# Patient Record
Sex: Female | Born: 1970 | Race: Black or African American | Hispanic: No | Marital: Married | Smoking: Never smoker
Health system: Southern US, Community
[De-identification: ages and names within clinical notes are randomized; demographics above are authoritative.]

## PROBLEM LIST (undated history)

## (undated) DIAGNOSIS — M1990 Unspecified osteoarthritis, unspecified site: Secondary | ICD-10-CM

## (undated) HISTORY — PX: BACK SURGERY: SHX140

---

## 2000-04-18 ENCOUNTER — Inpatient Hospital Stay (HOSPITAL_COMMUNITY): Admission: AD | Admit: 2000-04-18 | Discharge: 2000-04-18 | Payer: Self-pay | Admitting: Obstetrics

## 2000-05-05 ENCOUNTER — Ambulatory Visit (HOSPITAL_COMMUNITY): Admission: RE | Admit: 2000-05-05 | Discharge: 2000-05-05 | Payer: Self-pay | Admitting: *Deleted

## 2000-09-14 ENCOUNTER — Inpatient Hospital Stay (HOSPITAL_COMMUNITY): Admission: AD | Admit: 2000-09-14 | Discharge: 2000-09-18 | Payer: Self-pay | Admitting: *Deleted

## 2002-04-18 ENCOUNTER — Emergency Department (HOSPITAL_COMMUNITY): Admission: EM | Admit: 2002-04-18 | Discharge: 2002-04-18 | Payer: Self-pay | Admitting: Emergency Medicine

## 2002-06-06 ENCOUNTER — Emergency Department (HOSPITAL_COMMUNITY): Admission: EM | Admit: 2002-06-06 | Discharge: 2002-06-06 | Payer: Self-pay | Admitting: Emergency Medicine

## 2002-06-06 ENCOUNTER — Encounter: Payer: Self-pay | Admitting: Emergency Medicine

## 2002-07-10 ENCOUNTER — Encounter: Payer: Self-pay | Admitting: Internal Medicine

## 2002-07-10 ENCOUNTER — Ambulatory Visit (HOSPITAL_COMMUNITY): Admission: RE | Admit: 2002-07-10 | Discharge: 2002-07-10 | Payer: Self-pay | Admitting: Internal Medicine

## 2006-01-11 ENCOUNTER — Ambulatory Visit (HOSPITAL_COMMUNITY): Admission: RE | Admit: 2006-01-11 | Discharge: 2006-01-11 | Payer: Self-pay | Admitting: *Deleted

## 2006-02-10 ENCOUNTER — Inpatient Hospital Stay (HOSPITAL_COMMUNITY): Admission: AD | Admit: 2006-02-10 | Discharge: 2006-02-10 | Payer: Self-pay | Admitting: Obstetrics & Gynecology

## 2006-06-16 ENCOUNTER — Inpatient Hospital Stay (HOSPITAL_COMMUNITY): Admission: AD | Admit: 2006-06-16 | Discharge: 2006-06-19 | Payer: Self-pay | Admitting: Obstetrics & Gynecology

## 2007-01-18 ENCOUNTER — Ambulatory Visit (HOSPITAL_COMMUNITY): Admission: RE | Admit: 2007-01-18 | Discharge: 2007-01-19 | Payer: Self-pay | Admitting: Specialist

## 2008-06-24 ENCOUNTER — Ambulatory Visit (HOSPITAL_COMMUNITY): Admission: RE | Admit: 2008-06-24 | Discharge: 2008-06-24 | Payer: Self-pay | Admitting: Family Medicine

## 2008-07-14 ENCOUNTER — Ambulatory Visit (HOSPITAL_COMMUNITY): Admission: RE | Admit: 2008-07-14 | Discharge: 2008-07-14 | Payer: Self-pay | Admitting: Family Medicine

## 2008-08-11 ENCOUNTER — Ambulatory Visit (HOSPITAL_COMMUNITY): Admission: RE | Admit: 2008-08-11 | Discharge: 2008-08-11 | Payer: Self-pay | Admitting: Family Medicine

## 2008-10-09 ENCOUNTER — Ambulatory Visit (HOSPITAL_COMMUNITY): Admission: RE | Admit: 2008-10-09 | Discharge: 2008-10-09 | Payer: Self-pay | Admitting: Family Medicine

## 2008-11-12 ENCOUNTER — Ambulatory Visit: Payer: Self-pay | Admitting: Obstetrics and Gynecology

## 2008-11-12 ENCOUNTER — Inpatient Hospital Stay (HOSPITAL_COMMUNITY): Admission: AD | Admit: 2008-11-12 | Discharge: 2008-11-12 | Payer: Self-pay | Admitting: Gynecology

## 2008-11-13 ENCOUNTER — Inpatient Hospital Stay (HOSPITAL_COMMUNITY): Admission: RE | Admit: 2008-11-13 | Discharge: 2008-11-16 | Payer: Self-pay | Admitting: Obstetrics & Gynecology

## 2008-11-13 ENCOUNTER — Ambulatory Visit: Payer: Self-pay | Admitting: Obstetrics & Gynecology

## 2011-01-18 LAB — CROSSMATCH: ABO/RH(D): O POS

## 2011-01-18 LAB — CBC
HCT: 27.7 % — ABNORMAL LOW (ref 36.0–46.0)
HCT: 34.1 % — ABNORMAL LOW (ref 36.0–46.0)
Hemoglobin: 11.2 g/dL — ABNORMAL LOW (ref 12.0–15.0)
Hemoglobin: 9.3 g/dL — ABNORMAL LOW (ref 12.0–15.0)
MCHC: 32.8 g/dL (ref 30.0–36.0)
MCHC: 33.7 g/dL (ref 30.0–36.0)
MCV: 81.2 fL (ref 78.0–100.0)
MCV: 81.5 fL (ref 78.0–100.0)
Platelets: 146 10*3/uL — ABNORMAL LOW (ref 150–400)
Platelets: 175 10*3/uL (ref 150–400)
RBC: 3.4 MIL/uL — ABNORMAL LOW (ref 3.87–5.11)
RBC: 4.2 MIL/uL (ref 3.87–5.11)
RDW: 14.3 % (ref 11.5–15.5)
RDW: 14.4 % (ref 11.5–15.5)
WBC: 6.1 10*3/uL (ref 4.0–10.5)
WBC: 7.6 10*3/uL (ref 4.0–10.5)

## 2011-01-18 LAB — ABO/RH: ABO/RH(D): O POS

## 2011-01-18 LAB — RPR: RPR Ser Ql: NONREACTIVE

## 2011-02-15 NOTE — Discharge Summary (Signed)
Anne French, Anne French              ACCOUNT NO.:  [id number]   MEDICAL RECORD NO.:  [id number]          PATIENT TYPE:  INP   LOCATION:  9144                          FACILITY:  [facility]   PHYSICIAN:  Norton Blizzard, MD    DATE OF BIRTH:  14-Dec-1970   DATE OF ADMISSION:  11/13/2008  DATE OF DISCHARGE:  11/16/2008                               DISCHARGE SUMMARY   ADMITTING DIAGNOSIS:  Pregnancy at term for repeat cesarean section.   POSTOPERATIVE DIAGNOSIS:  Repeat low transverse cesarean section with  lysis of adhesions for Dr. Silas Flood and assisted Dr. Noel Gerold.   HOSPITAL COURSE:  Hospital course has been uneventful.  The patient is  ambulating well, taking p.o. fluids and solids well, emptying bladder  without complications, and passing flatus.   PHYSICAL EXAMINATION:  VITAL SIGNS:  Stable.  HEART:  Regular rhythm and rate.  LUNGS:  Clear to auscultation bilaterally.  ABDOMEN:  Soft and nontender.  Bowel sounds are present x4.  Incision is  intact.  No redness, swelling or drainage.  Fundus is firm.  Lochia  small amount.  EXTREMITIES:  There is no edema in the lower extremities.   ASSESSMENT:  Stable postoperative day #3 with anemia and hemoglobin 9.6.   DISCHARGE MEDICATIONS:  1. Chromagen Forte b.i.d.  2. Motrin 600 one p.o. q.6 h. p.r.n. cramping.  3. Lortab 5/325 one p.o. q.4 h. p.r.n. pain.  4. She is to continue her prenatal vitamins.   PLAN:  Discharge her home. The [facility] Nurse is going to come to the  clinic on postop day #5 to remove her staples, and she is to follow up  in 6 weeks to [facility].      Zerita Boers, N.M.      Norton Blizzard, MD  Electronically Signed   DL/MEDQ  D:  04/54/0981  T:  11/16/2008  Job:  [id number]   cc:   Norton Blizzard, MD

## 2011-02-15 NOTE — Op Note (Signed)
Anne French, Anne French              ACCOUNT NO.:  [id number]   MEDICAL RECORD NO.:  [id number]          PATIENT TYPE:  INP   LOCATION:  9144                          [facility]   PHYSICIAN:  Norton Blizzard, MD    DATE OF BIRTH:  1970/12/24   DATE OF PROCEDURE:  11/13/2008  DATE OF DISCHARGE:                               OPERATIVE REPORT   PREOPERATIVE DIAGNOSES:  1. Term intrauterine pregnancy.  2. History of previous cesarean section.  3. Borderline elevated fetoprotein  4. Pelvic adhesive disease.   POSTPROCEDURE DIAGNOSES:  1. Term intrauterine pregnancy.  2. History of previous cesarean section.  3. Borderline elevated fetoprotein.  4. Pelvic adhesive disease.   PROCEDURES:  1. A repeat low-transverse cesarean section.  2. Lysis adhesions.   SURGEON:  Norton Blizzard, MD   ASSISTANT:  Odie Sera, DO   ANESTHESIA:  Spinal and local.   INDICATIONS FOR PROCEDURE:  Anne French is a 40 year old gravida  4 now para 4-0-0-4 who presented at 39-4/7 weeks for an elective repeat  low-transverse cesarean section.  She has a history of 2 previous low-  transverse cesarean sections.  She has an uncomplicated prenatal course  during this pregnancy except for a mildly elevated AFP with normal  ultrasounds.  She has previously been counseled risks and benefits of  repeat low-transverse cesarean section to include, but not limited to  bleeding, infection, damage to internal organs, and a possible need for  further surgeries to include the hysterectomy.  The patient voiced  understanding of these risks and desires to proceed with the procedure.   DESCRIPTION OF PROCEDURE:  The patient was taken to the operating room  where spinal anesthesia was introduced.  She was then prepped and draped  in usual sterile manner and placed in left dorsal supine position.  A  time-out was conducted.  A Pfannenstiel incision was made in the skin  and extended through the subcutaneous  layers down to the fascia.  The  fascia was incised in the midline using Bovie.  The fascial incision was  extended laterally using the Mayo scissors.  The fascia was then  dissected off the underlying rectus muscles with blunt dissection sharp  dissection using the Mayo scissors.  Moderate amount of scar tissue was  noted underlying the fascia.  The rectus muscles were then separated in  the midline and the opening was extended first laterally and then with  blunt dissection with electrocautery.  The rectus muscles were dissected  on the left side approximately one-third of the way across at  approximately the midline position.  The peritoneum was entered bluntly  with a finger.  The peritoneal opening was extended with manual  traction.  Moderate adhesive disease was noted of the peritoneum to the  anterior aspect of the uterus.  The several adhesions were lysed using  electrocautery.  Bladder blade was then placed.  An appropriate opening  to the uterus was obtained.  A transverse incision was made in lower  uterine segment with a scalpel and extended through the myometrial  layers until bulging membranes  were noted.  The uterine incision was  extended with manual traction first and then the right aspect was  extended somewhat further using bandage scissors.  The membranes were  ruptured and clear amniotic fluid was noted.  The fetal head was grasped  and was delivered with the assistance of fundal pressure.  Vacuum was  used after 2 pop offs.  The opening to the uterus was extended laterally  on each side and allowed for adequate delivery of the head.  The head  then delivered.  The mouth and nares were bulb suctioned.  The shoulders  followed by rest of corpus were delivered without difficulty.  The mouth  and nares were bulb suctioned again and spontaneous cry was noted.  Good  tone was noted.  The cord was clamped and cut and the baby was handed to  awaiting NICU staff.  The  placenta then delivered spontaneously, it was  intact and accessory lobe was noted.  The uterus was cleared of clots  and debris using a dry lap sponge.  A Kelly forceps was placed in the  lower aspect of the uterus through the cervix and used to dilate the  cervix slightly.  The uterine incision was then repaired using 0  Monocryl in a running interlocking fashion.  The uterus was closed in a  single layer closure.  Good hemostasis was then noted.  Both fallopian  tubes and ovaries were identified and appeared grossly normal.  The  gutters were cleared of blood and clots.  The fascia was then closed  using 0 PDS suture in a running noninterlocking fashion.  Good  hemostasis was noted.  There were no defects noted in the fascia.  The  skin was then closed with staples in the usual manner.  A pressure  dressing was applied.  Please note before closure of the skin, the  subcutaneous tissues were injected with 10 mL of 0.25% Marcaine without  epinephrine.   FINDINGS:  1. Clear amniotic fluid.  2. Viable female infant  3. Bilateral fallopian tubes grossly normal.   SPECIMENS:  Placenta to labor and delivery.   ESTIMATED BLOOD LOSS:  700 mL.  There no immediate complications.  The  patient was taken to PACU in good condition.      Odie Sera, DO  Electronically Signed     ______________________________  Norton Blizzard, MD    MC/MEDQ  D:  11/13/2008  T:  11/13/2008  Job:  [id number]

## 2011-02-18 NOTE — Op Note (Signed)
NAMEBRITTYN, French              ACCOUNT NO.:  [id number]   MEDICAL RECORD NO.:  [id number]          PATIENT TYPE:  INP   LOCATION:  9144                          [facility]AC[facility]  [facility]   PHYSICIAN:  Roseanna Rainbow, M.D.DATE OF BIRTH:  1971/02/10   DATE OF PROCEDURE:  06/16/2006  DATE OF DISCHARGE:                                 OPERATIVE REPORT   PREOPERATIVE DIAGNOSES:  1. Intrauterine pregnancy at term.  2. History of previous cesarean delivery, declines trial of labor.   POSTOPERATIVE DIAGNOSES:  1. Intrauterine pregnancy at term.  2. History of previous cesarean delivery, declines trial of labor.   OPERATION/PROCEDURE:  Repeat cesarean delivery.   SURGEON:  Roseanna Rainbow, M.D.  Coral Ceo, M.D.   ANESTHESIA:  Spinal.   ESTIMATED BLOOD LOSS:  600 mL.   COMPLICATIONS:  None.   DESCRIPTION OF PROCEDURE:  The patient was taken to the operating room with  an IV running.  A spinal anesthetic was then administered. She was placed in  the dorsal supine position with a leftward tilt and prepped and draped in  the usual sterile fashion.  A Pfannenstiel skin incision was then made with  the scalpel and carried down to the underlying fascia with the Bovie.  The  fascia was then nicked in the midline.  A fascial incision was then extended  bilaterally with curved Mayo scissors.  The superior aspect of the fascial  incision was tented up and the underlying rectus muscle dissected off.  The  inferior aspect of the fascial incision was manipulated in a similar  fashion.  The rectus muscles were separated in the midline.  The parietal  peritoneum was entered.  This incision was then extended superiorly and  inferiorly with good visualization of the bladder.  The bladder blade was  placed. The vesicouterine peritoneum was tented up and entered sharply with  Metzenbaum scissors.  This incision was then extended bilaterally and the  bladder flap created sharply.  The bladder  blade was placed.  The lower  uterine segment was incised in the transverse fashion with the scalpel.  The  uterine incision was then extended bluntly.  The infant's head was delivered  atraumatically.  The oropharynx was suctioned with bulb suction.  The cord  was clamped and cut.  The infant was handed off to the awaiting  neonatologist.  The placenta was then removed.  The intrauterine cavity was  evacuated of any remaining amniotic fluid, clots and debris with a moist  laparotomy sponge.  The uterine incision was then reapproximated in a  running interlocking fashion using 0 Monocryl.  Second imbricating layer of  the same suture was then placed.  Adequate hemostasis was noted.  The  pericolic gutters were then irrigated.  At this point, omental adhesions  involving the parietal peritoneum were then divided with cautery.  The  parietal peritoneum was reapproximated in a running fashion with 2-0 Vicryl.  The fascia was to be approximated in a running fashion using 0 PDS.  The  skin was reapproximated in a subcuticular fashion using 3-0 Monocryl.  Dermabond was then  applied.  One gram of cephazolin had been given at cord clamp.  The patient  was taken to the PACU awake and in stable condition.  Sleep as noted at the  close of the procedure.  The instrument and pack counts were said to be  correct x2.      Roseanna Rainbow, M.D.  Electronically Signed     LAJ/MEDQ  D:  06/16/2006  T:  06/17/2006  Job:  [id number]

## 2011-02-18 NOTE — H&P (Signed)
Anne French, Anne French              ACCOUNT NO.:  [id number]   MEDICAL RECORD NO.:  [id number]          PATIENT TYPE:  INP   LOCATION:  NA                            [facility]AC[facility]  [facility]   PHYSICIAN:  Roseanna Rainbow, M.D.DATE OF BIRTH:  1971-06-09   DATE OF ADMISSION:  06/15/2006  DATE OF DISCHARGE:                                HISTORY & PHYSICAL   CHIEF COMPLAINT:  The patient is a 40 year old, G3, P2 with an estimated  date of confinement of June 21, 2006, with an intrauterine pregnancy at  39+ weeks with a history of a previous cesarean delivery now for an elective  repeat cesarean delivery.   HISTORY OF PRESENT ILLNESS:  Please see the above.   ALLERGIES:  No known drug allergies.   MEDICATIONS:  Percocet, prenatal vitamins.   OB RISK FACTORS:  Previous cesarean delivery.   PRENATAL LABORATORY DATA:  Hemoglobin 11.2, hematocrit 33.3, platelets  204,000.  Chlamydia negative.  Urine culture and sensitivity no growth.  GC  negative.  One-hour GTT 112.  Hepatitis B surface antigen negative.  HIV  negative.  Blood type O positive.  Antibody screen negative.  RPR  nonreactive.  Rubella immune.  Sickle cell negative.   PAST GYNECOLOGICAL HISTORY:  History of female circumcision.  Please see the  above.   PAST MEDICAL HISTORY:  1. GERD.  2. Herniated lumbar disc.   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   SOCIAL HISTORY:  She is a Location manager.  Married living with her spouse.  Does not give any significant history of alcohol use.  She has no  significant smoking history.   FAMILY HISTORY:  No major illnesses known.   PAST OBSTETRICAL HISTORY:  In December 2001, she delivered a live born  female, full-term cesarean delivery.  In March 2000, she delivered a live  born female, 7 pounds 5 ounces, vaginal delivery.   ASSESSMENT:  1. Multiparous with an intrauterine pregnancy at term.  2. History of a previous cesarean delivery.  3.  Declines trial of labor.   PLAN:  Admission with repeat cesarean delivery.      Roseanna Rainbow, M.D.  Electronically Signed     LAJ/MEDQ  D:  06/15/2006  T:  06/15/2006  Job:  [id number]

## 2011-02-18 NOTE — Discharge Summary (Signed)
[facility]  Patient:    Anne French, Anne French                     MRN: [id number] Adm. Date:  [id number] Disc. Date: [id number] Attending:  Antionette Char Dictator:   Ocie Doyne, M.D.                           Discharge Summary  DISCHARGE DIAGNOSIS:          Status post low transverse cesarean section                               with delivery of a single live born.  DISCHARGE MEDICATIONS:        1. Ibuprofen 600 mg p.o. q.6h. p.r.n. pain.                               2. Tylox one p.o. q.6h. p.r.n. pain.                               3. Micronor oral contraceptive pills one p.o. qd                                  to start on September 24, 2000.                               4. Prenatal vitamins one p.o. q.d. x six weeks.  ADMISSION HISTORY AND PHYSICAL:                     This 41 year old G2, P1-0-01, at 40 weeks by 21-week ultrasound, presented to [facility] for a labor check. She stated that she was having contractions, that she felt her membranes had ruptured that morning when she woke up, she was feeling the baby move, and she had some bloody discharge from the vagina.  PAST MEDICAL HISTORY:         Significant for a normal spontaneous vaginal delivery at term in March of 2000 of a 6-pound 6-ounce female infant. 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  MEDICATIONS:                  Prenatal vitamins.  ALLERGIES:                    No known drug allergies.  SOCIAL HISTORY:               Nonsmoker and nondrinker. Denies illicit drug use.  ADMISSION EXAMINATION:  VITAL SIGNS:                  Temperature 98.7, blood pressure 112/67, pulse 67. Fetal heart rate 130-135 with accelerations. No decelerations. Uterine contractions every six to nine minutes.  HEENT:                         Within normal limits.  NECK:  No lymphadenopathy.  CHEST:                        Clear.  CARDIOVASCULAR:               Regular rate and rhythm.  ABDOMEN:                      Soft and gravid.  EXTREMITIES:                  Deep tendon reflexes 1/2. Pulses 2+. Trace edema, no clonus.  CERVIX:                       2 cm, 50%, and -1. She was Nitrazine positive and fern positive.  HOSPITAL COURSE:              The patient was admitted to labor and delivery with routine orders. She was started on some low-dose Pitocin as her cervix was not changing significantly and received an epidural without complications due to increasing discomfort following Pitocin. The following day, in spite of adequate trial of labor, the patient was beginning to fatigue. There was no further descent of the fetus despite expulsive efforts by the mother. A primary low transverse cesarean section was performed secondary to arrest of descent, possibly due to persistent OP position of the fetal head. Estimated blood loss was one liter. There were no complications.  The postpartum course was uncomplicated. The patient was breast-feeding without difficulty at the time of discharge, she stated that she wanted to have an IUD placed at six weeks and would use oral contraceptives until that time. Her pain control was adequate on p.o. medication and she was ambulating well with decreased lochia.  FOLLOWUP:                     Scheduled at [facility] in six weeks following discharge. DD:  10/11/00 TD:  10/11/00 Job: [id number]

## 2011-02-18 NOTE — Op Note (Signed)
[facility]  Patient:    OLA, RAAP                     MRN: [id number] Proc. Date: 09/15/00 Adm. Date:  [id number] Disc. Date: [id number] Attending:  Antionette Char                           Operative Report  PREOPERATIVE DIAGNOSES:       1. Intrauterine pregnancy at term.                               2. Arrest of descent, rule out ____________.  POSTOPERATIVE DIAGNOSES:      1. Intrauterine pregnancy at term.                               2. Arrest of descent, rule out ____________.                               3. Persistent occiput posterior.  OPERATION/PROCEDURE:          Primary low transverse cesarean section via                               Pfannenstiel incision.  SURGEON:                      Roseanna Rainbow, M.D.  ANESTHESIA:                   Epidural anesthesia.  COMPLICATIONS:                None.  ESTIMATED BLOOD LOSS:         One liter.  FLUIDS:                       1500 cc lactated Ringers.  URINE OUTPUT:                 Initially blood tinged prior to surgery with                               clearing at the end of the procedure.                               Approximately 300 cc.  INDICATION FOR PROCEDURE:     The patient is a para 1 at term who presented with spontaneous rupture of membranes.  With Pitocin augmentation, she reached full dilatation with descent to 0 to +1 station.  FINDINGS:                     The infant was in cephalic presentation. Neonatology present at the delivery.  Apgars 9 and 9.  Weight 7 pounds 6 ounces.  Normal uterus, tubes and ovaries.  DESCRIPTION OF PROCEDURE:     This patient was taken to the operating room where epidural anesthetic was found to be adequate.  She was then prepped and draped in the usual sterile fashion in dorsal supine position with a leftward tilt.  The Pfannenstiel  skin incision was then made with the scalpel and carried through to the underlying  layer of fascia.  The fascia was incised in the midline and the incision extended laterally with the Mayo scissors. The superior aspect of the fascial incision was then grasped with Kocher clamps, elevated and underlying rectus muscle dissected off.  Attention was then turned to the inferior aspect of the incision which was manipulated in a similar fashion.  The rectus muscles were separated in the midline and the parietoperitoneum identified, tented up and entered sharply.  The peritoneal incision was then extended superiorly and inferiorly with good visualization of the bladder. The bladder blade was then inserted and the vesicouterine peritoneum identified, grasped with pickups and entered sharply.  This incision was then extended laterally and the bladder flap created digitally. The bladder blade was then reinserted and the lower uterine segment incised in a transverse fashion with the scalpel. The uterine incision was then extended laterally bluntly. The bladder blade was removed and the infants head delivered atraumatically.  The nose and mouth were suctioned with bulb suction and the cord clamped and cut.  The infant was handed off to the awaiting neonatologist.  The placenta was then removed.  The uterus cleared of all clots and debris.  The uterine incision was repaired with 0 Monocryl in running locked fashion.  Individual figure-of-eight sutures of the same were used to obtain excellent hemostasis.  The gutters were cleared of all clots and the fascia was reapproximated with 0 Vicryl in running fashion.  The skin was closed with staples.  The patient tolerated the procedure well.  Sponge, lap and needle counts correct x 2.  There were 2 g of cefazolin given at cord clamp.  The patient was taken to the PACU in stable condition. DD:  09/18/00 TD:  09/19/00 Job: [id number]

## 2011-02-18 NOTE — Op Note (Signed)
NAMEHENRY, Anne              ACCOUNT NO.:  [id number]   MEDICAL RECORD NO.:  [id number]          PATIENT TYPE:  AMB   LOCATION:  DAY                          FACILITY:  [facility]   PHYSICIAN:  Jene Every, M.D.    DATE OF BIRTH:  1970/11/24   DATE OF PROCEDURE:  01/18/2007  DATE OF DISCHARGE:                               OPERATIVE REPORT   PREOPERATIVE DIAGNOSIS:  Spinal stenosis, herniated nucleus pulposus, L5-  S1 left.   POSTOPERATIVE DIAGNOSIS:  Spinal stenosis, herniated nucleus pulposus,  L5-S1 left.   PROCEDURE PERFORMED:  Lateral recess decompression and foraminotomy, S1  left; microdiskectomy, L5-S1 left.   ANESTHESIA:  General.   ASSISTANT:  Roma Schanz, P.A.   BRIEF HISTORY AND INDICATIONS:  A 40 year old with refractory S1  radiculopathy, positive neural tension signs, diminished plantar  flexion, MRI indicating a small disk herniation with lateral recess  stenosis compressing the nerve root.  She was refractory to conservative  treatment, had radicular pain in the outer aspect of the foot, disk  degeneration at L5-S1, had some back pain but mainly buttock and leg  pain.  It felt that decompression of the S1 nerve root would relieve her  symptoms and augment her activities of daily living.  The risks and  benefits were discussed, including bleeding, infection, damage to  vascular structures, CSF leakage, epidural fibrosis, need for fusion in  the future, anesthetic complications etc.   TECHNIQUE:  The patient in supine position.  After the induction of  adequate anesthesia and 2 g Kefzol, she placed prone on the Andrews  frame and all bony prominences well-padded.  The lumbar region was  prepped and draped in the usual sterile fashion.  An 18 gauge-spinal  needle was utilized to localize the L5-S1 space, confirmed with x-ray.  Incision was made at the spinous process of L5 to S1.  The subcutaneous  tissue was dissected, electrocautery utilized to  achieve hemostasis.  The dorsal lumbar fascia was identified and divided in the line of the  skin incision, paraspinous muscle elevated from the lamina of L5 and S1.  A McCullough retractor was placed.  The operating microscope was draped  and brought into the surgical field.  Hemilaminotomy of the caudad edge  of L5 was performed with 2 mm Kerrison, the cephalad edge of S1 with a 2  mm Kerrison.  It was extremely tight into the lateral recess.  After the  foraminotomy was performed, I decompressed the lateral recess to the  medial border of the pedicle.  There was fairly narrow room for the S1  nerve root.  It was found to be erythematous and edematous, signifying  an area of compression.  I detached the ligamentum flavum from the  caudad edge of L5 following the hemilaminotomy and followed the root to  its shoulder.  I then gently mobilized it medially.  A focal HNP was  noted and an annulotomy was performed.  A copious portion of disk  material was removed from the disk with the straight and upbiting  pituitary.  A full diskectomy of herniated material was performed.  I  then checked the foramen of L5, S1 beneath the thecal sac and the  shoulder and the axilla of the root.  There was no evidence of residual  neural compression of the root.  I then copiously irrigated, had no  evidence of CSF leakage or active bleeding.  Bone wax was placed on the  cancellous surfaces.  I removed the McCullough retractor.  Paraspinous  muscles inspected and no evidence of active bleeding.  I irrigated and  the fascia closed with 0 Vicryl simple sutures, subcu with 2-0, skin  with  4-0 subcuticular, wound reinforced with Steri-Strips, sterile dressing  applied, placed supine on her hospital bed, extubated without  difficulty, and transported to the recovery room in satisfactory  condition.   The patient tolerated the procedure well.  There were no complications.      Jene Every, M.D.   Electronically Signed     JB/MEDQ  D:  01/18/2007  T:  01/18/2007  Job:  [id number]

## 2011-02-18 NOTE — Discharge Summary (Signed)
Anne French, Anne French              ACCOUNT NO.:  [id number]   MEDICAL RECORD NO.:  [id number]          PATIENT TYPE:  INP   LOCATION:  9144                          FACILITY:  [facility]   PHYSICIAN:  Roseanna Rainbow, M.D.DATE OF BIRTH:  10-22-1970   DATE OF ADMISSION:  06/16/2006  DATE OF DISCHARGE:  06/19/2006                                 DISCHARGE SUMMARY   CHIEF COMPLAINT:  The patient is a 40 year old gravida 3, para 2 with an  estimated date of confinement of June 21, 2006 with an intrauterine  pregnancy at 39+ weeks with a history of a previous cesarean delivery now  for an elective repeat cesarean delivery.  Please see the dictated history  and physical for further details.   HOSPITAL COURSE:  The patient was admitted and underwent a repeat cesarean  delivery.  Please see the dictated operative summary.  On postoperative day  #1, her hemoglobin was 8.8.  She was hemodynamically stable.  She was  started on ferrous sulfate twice daily.  The remainder of her hospital  course was uneventful.  She was discharged to home on postoperative day #3  with plans for an IUD as a contraceptive method.   DISCHARGE DIAGNOSIS:  Intrauterine pregnancy at term, history of previous  cesarean delivery, declined trial of labor.   PROCEDURE:  Repeat cesarean delivery.   CONDITION:  Good.   DIET:  Regular.   ACTIVITY:  Progressive activity, pelvic rest.   MEDICATIONS:  Percocet, ibuprofen, prenatal vitamins, ferrous sulfate.   DISPOSITION:  The patient was to follow up in the office in 1 to 2 weeks.      Roseanna Rainbow, M.D.  Electronically Signed     LAJ/MEDQ  D:  07/09/2006  T:  07/10/2006  Job:  [id number]

## 2011-12-05 ENCOUNTER — Emergency Department (HOSPITAL_COMMUNITY)
Admission: EM | Admit: 2011-12-05 | Discharge: 2011-12-05 | Disposition: A | Discharge status: Home / Self Care | Payer: Medicaid Other | Attending: Emergency Medicine | Admitting: Emergency Medicine

## 2011-12-05 ENCOUNTER — Encounter (HOSPITAL_COMMUNITY): Payer: Self-pay

## 2011-12-05 DIAGNOSIS — L509 Urticaria, unspecified: Secondary | ICD-10-CM | POA: Insufficient documentation

## 2011-12-05 DIAGNOSIS — L299 Pruritus, unspecified: Secondary | ICD-10-CM | POA: Insufficient documentation

## 2011-12-05 MED ORDER — DIPHENHYDRAMINE HCL 25 MG PO TABS: 25.0000 mg | ORAL_TABLET | Freq: Four times a day (QID) | ORAL | Status: DC

## 2011-12-05 MED ORDER — PREDNISONE 20 MG PO TABS: 60.0000 mg | ORAL_TABLET | Freq: Every day | ORAL | Status: AC

## 2011-12-05 MED ORDER — FLUCONAZOLE 150 MG PO TABS: 300.0000 mg | ORAL_TABLET | Freq: Once | ORAL | Status: AC

## 2011-12-05 MED ORDER — FAMOTIDINE 20 MG PO TABS: 20.0000 mg | ORAL_TABLET | Freq: Two times a day (BID) | ORAL | Status: DC

## 2011-12-05 NOTE — ED Notes (Signed)
Patient presents with rash beginning to face spreading to bilateral hands since this past Friday. Patient reports burning and itching to face and bilateral hands. Small intact bumps are present to bilateral hands and face.

## 2011-12-05 NOTE — ED Notes (Signed)
Pt denies change in soaps, toiletries. Denies travel or new animals in house. Denies change in meds. Has only tried ibuprofen for rash.

## 2011-12-05 NOTE — ED Provider Notes (Signed)
History     CSN: [id number]  Arrival date & time 12/05/11  1301   First MD Initiated Contact with Patient 12/05/11 1412      Chief Complaint  Patient presents with  . Rash    (Consider location/radiation/quality/duration/timing/severity/associated sxs/prior treatment) Patient is a 41 y.o. female presenting with rash. The history is provided by the patient.  Rash  This is a new problem. The current episode started 2 days ago. The problem has not changed since onset.Associated with: She reports cold recently for which she took ibuprofen as she has in the past. No other new medications, soaps, foods. No SOB, swelling or difficulty swallowing. There has been no fever. The rash is present on the face, right hand, torso, neck and left hand. The patient is experiencing no pain. Associated symptoms include itching. She has tried nothing for the symptoms.    History reviewed. No pertinent past medical history.  Past Surgical History  Procedure Date  . Back surgery     History reviewed. No pertinent family history.  History  Substance Use Topics  . Smoking status: Never Smoker   . Smokeless tobacco: Not on file  . Alcohol Use: No    OB History    Grav Para Term Preterm Abortions TAB SAB Ect Mult Living                  Review of Systems  Constitutional: Negative for fever and chills.  HENT: Negative.   Respiratory: Negative.  Negative for shortness of breath.   Cardiovascular: Negative.   Gastrointestinal: Negative.   Musculoskeletal: Negative.   Skin: Positive for itching and rash.  Neurological: Negative.     Allergies  Review of patient's allergies indicates no known allergies.  Home Medications   Current Outpatient Rx  Name Route Sig Dispense Refill  . IBUPROFEN 200 MG PO TABS Oral Take 800 mg by mouth every 6 (six) hours as needed. As needed for pain.      BP 107/77  Pulse 78  Temp 98.9 F (37.2 C)  Resp 18  SpO2 97%  LMP 11/14/2011  Physical Exam    Constitutional: She is oriented to person, place, and time. She appears well-developed and well-nourished.  Neck: Normal range of motion.  Cardiovascular: Normal rate and regular rhythm.   No murmur heard. Pulmonary/Chest: Effort normal and breath sounds normal.  Neurological: She is alert and oriented to person, place, and time.  Skin: Skin is warm and dry.       Raised, urticarial rash to face surrounding eyes,including eye lids. Sclera, conjunctiva normal. Similar rash to hands and neck.     ED Course  Procedures (including critical care time)  Labs Reviewed - No data to display No results found.   No diagnosis found.    MDM          Rodena Medin, PA-C 12/05/11 1523

## 2011-12-05 NOTE — ED Provider Notes (Signed)
Medical screening examination/treatment/procedure(s) were performed by non-physician practitioner and as supervising physician I was immediately available for consultation/collaboration.  Doug Sou, MD 12/05/11 1734

## 2011-12-05 NOTE — Discharge Instructions (Signed)
IF SYMPTOMS PERSIST, FOLLOW UP WITH A DERMATOLOGIST OF YOUR CHOICE. RETURN HERE WITH ANY SWELLING OF TONGUE OR LIPS, DIFFICULTY SWALLOWING OR BREATHING. TAKE MEDICATIONS AS PRESCRIBED.  Allergic Reaction, Mild to Moderate Allergies may happen from anything your body is sensitive to. This may be food, medications, pollens, chemicals, and nearly anything around you in everyday life that produces allergens. An allergen is anything that causes an allergy producing substance. Allergens cause your body to release allergic antibodies. Through a chain of events, they cause a release of histamine into the blood stream. Histamines are meant to protect you, but they also cause your discomfort. This is why antihistamines are often used for allergies. Heredity is often a factor in causing allergic reactions. This means you may have some of the same allergies as your parents. Allergies happen in all age groups. You may have some idea of what caused your reaction. There are many allergens around [place]. It may be difficult to know what caused your reaction. If this is a first time event, it may never happen again. Allergies cannot be cured but can be controlled with medications. SYMPTOMS  You may get some or all of the following problems from allergies.  Swelling and itching in and around the mouth.   Tearing, itchy eyes.   Nasal congestion and runny nose.   Sneezing and coughing.   An itchy red rash or hives.   Vomiting or diarrhea.   Difficulty breathing.  Seasonal allergies occur in all age groups. They are seasonal because they usually occur during the same season every year. They may be a reaction to molds, grass pollens, or tree pollens. Other causes of allergies are house dust mite allergens, pet dander and mold spores. These are just a common few of the thousands of allergens around [place]. All of the symptoms listed above happen when you come in contact with pollens and other allergens. Seasonal allergies are  usually not life threatening. They are generally more of a nuisance that can often be handled using medications. Hay fever is a combination of all or some of the above listed allergy problems. It may often be treated with simple over-the-counter medications such as diphenhydramine. Take medication as directed. Check with your caregiver or package insert for child dosages. TREATMENT AND HOME CARE INSTRUCTIONS If hives or rash are present:  Take medications as directed.   You may use an over-the-counter antihistamine (diphenhydramine) for hives and itching as needed. Do not drive or drink alcohol until medications used to treat the reaction have worn off. Antihistamines tend to make people sleepy.   Apply cold cloths (compresses) to the skin or take baths in cool water. This will help itching. Avoid hot baths or showers. Heat will make a rash and itching worse.   If your allergies persist and become more severe, and over the counter medications are not effective, there are many new medications your caretaker can prescribe. Immunotherapy or desensitizing injections can be used if all else fails. Follow up with your caregiver if problems continue.  SEEK MEDICAL CARE IF:   Your allergies are becoming progressively more troublesome.   You suspect a food allergy. Symptoms generally happen within 30 minutes of eating a food.   Your symptoms have not gone away within 2 days or are getting worse.   You develop new symptoms.   You want to retest yourself or your child with a food or drink you think causes an allergic reaction. Never test yourself or your child of  a suspected allergy without being under the watchful eye of your caregivers. A second exposure to an allergen may be life-threatening.  SEEK IMMEDIATE MEDICAL CARE IF:  You develop difficulty breathing or wheezing, or have a tight feeling in your chest or throat.   You develop a swollen mouth, hives, swelling, or itching all over your body.    A severe reaction with any of the above problems should be considered life-threatening. If you suddenly develop difficulty breathing call for local emergency medical help. THIS IS AN EMERGENCY. MAKE SURE YOU:   Understand these instructions.   Will watch your condition.   Will get help right away if you are not doing well or get worse.  Document Released: 07/17/2007 Document Revised: 09/08/2011 Document Reviewed: 07/17/2007 [facility] Patient Information 2012 [place], [place].

## 2012-05-15 ENCOUNTER — Encounter (HOSPITAL_COMMUNITY): Payer: Self-pay | Admitting: Emergency Medicine

## 2012-05-15 ENCOUNTER — Emergency Department (HOSPITAL_COMMUNITY)
Admission: EM | Admit: 2012-05-15 | Discharge: 2012-05-15 | Disposition: A | Discharge status: Home / Self Care | Payer: Medicaid Other | Attending: Emergency Medicine | Admitting: Emergency Medicine

## 2012-05-15 DIAGNOSIS — J019 Acute sinusitis, unspecified: Secondary | ICD-10-CM | POA: Insufficient documentation

## 2012-05-15 MED ORDER — SALINE NASAL SPRAY 0.65 % NA SOLN: 1.0000 | NASAL | Status: DC | PRN

## 2012-05-15 MED ORDER — AMOXICILLIN-POT CLAVULANATE 875-125 MG PO TABS: 1.0000 | ORAL_TABLET | Freq: Two times a day (BID) | ORAL | Status: AC

## 2012-05-15 NOTE — ED Provider Notes (Signed)
History     CSN: [id number]  Arrival date & time 05/15/12  1256   First MD Initiated Contact with Patient 05/15/12 1426      Chief Complaint  Patient presents with  . Nasal Congestion  . Shortness of Breath    (Consider location/radiation/quality/duration/timing/severity/associated sxs/prior treatment) HPI Comments: Anne French 41 y.o. female   The chief complaint is: Patient presents with:   Nasal Congestion     Patient states that last night she started having myalgias and chills. She awoke in the middle of the night with acute sinus pain, pressure  Nasal congestion and facial swelling. She was very uncomfortable and was unable to sleep.She also had rhinorrhea.  Patient denies SOB, wheezing. Denies sore throat. Patient does have a non productive cough. Denies fevers,nausea, vomiting, diarrhea.     Patient is a 41 y.o. female presenting with shortness of breath. The history is provided by the patient. No language interpreter was used.  Shortness of Breath  Associated symptoms include cough. Pertinent negatives include no chest pain, no fever, no sore throat, no shortness of breath and no wheezing.    History reviewed. No pertinent past medical history.  Past Surgical History  Procedure Date  . Back surgery     L5    History reviewed. No pertinent family history.  History  Substance Use Topics  . Smoking status: Never Smoker   . Smokeless tobacco: Not on file  . Alcohol Use: No    OB History    Grav Para Term Preterm Abortions TAB SAB Ect Mult Living                  Review of Systems  Constitutional: Positive for chills. Negative for fever and diaphoresis.  HENT: Positive for ear pain (ear pressure), facial swelling and sinus pressure. Negative for sore throat, trouble swallowing, neck pain and neck stiffness.   Respiratory: Positive for cough. Negative for chest tightness, shortness of breath and wheezing.   Cardiovascular: Negative for chest pain  and palpitations.  Gastrointestinal: Negative for nausea, vomiting, diarrhea and abdominal distention.  Musculoskeletal: Positive for myalgias.    Allergies  Review of patient's allergies indicates no known allergies.  Home Medications   Current Outpatient Rx  Name Route Sig Dispense Refill  . DIPHENHYDRAMINE HCL 25 MG PO TABS Oral Take 25 mg by mouth every 6 (six) hours as needed. FOR ALLERGIES    . IBUPROFEN 200 MG PO TABS Oral Take 800 mg by mouth every 6 (six) hours as needed. As needed for pain.    Marland Kitchen AMOXICILLIN-POT CLAVULANATE 875-125 MG PO TABS Oral Take 1 tablet by mouth 2 (two) times daily. 14 tablet 0  . DIPHENHYDRAMINE HCL 25 MG PO TABS Oral Take 25 mg by mouth every 6 (six) hours. FOR PAIN    . SALINE NASAL SPRAY 0.65 % NA SOLN Nasal Place 1 spray into the nose as needed for congestion. 30 mL 12    BP 113/74  Pulse 87  Temp 98.8 F (37.1 C) (Oral)  Resp 14  SpO2 99%  Physical Exam  Nursing note and vitals reviewed. Constitutional: She is oriented to person, place, and time. She appears well-developed and well-nourished. No distress.  HENT:  Head: Normocephalic and atraumatic.  Mouth/Throat: Oropharynx is clear and moist. No oropharyngeal exudate.       TMs bulging BL, no signs of AOM. Swelling of the face in the sinus region. Nasal mucosa is swollen and red. No rhinorrhea.  Eyes: Conjunctivae are normal. No scleral icterus.  Neck: Normal range of motion.  Cardiovascular: Normal rate, regular rhythm and normal heart sounds.  Exam reveals no gallop and no friction rub.   No murmur heard. Pulmonary/Chest: Effort normal and breath sounds normal. No respiratory distress. She has no wheezes. She exhibits no tenderness.  Abdominal: Soft. Bowel sounds are normal. She exhibits no distension and no mass. There is no tenderness. There is no guarding.  Lymphadenopathy:    She has cervical adenopathy.  Neurological: She is alert and oriented to person, place, and time.    Skin: Skin is warm and dry. She is not diaphoretic.    ED Course  Procedures (including critical care time)  Labs Reviewed - No data to display No results found.  URI, and sinusitis. Lungs are clear and no cp. Patient looks ill and miserable. Patient was adamant that she have an antibiotic for her sinusitis. I explained that abx are not indicated unless symptoms have not resolved after 10 days.  I gave her an rx for  augmentin with the strict instructions that she should not use the RX unless her symptoms persist after 10 days from onset yesterday. Also that she should return to see [place] if she does have symptoms for 10 days. I also gave her an  Handwritten rx for atrovent nasal spray.  I will d/c with supportive care    1. Acute sinusitis       MDM  DC patient with sxs treatment. Discussed reasons to seek immediate care. Patient expresses understanding and agrees with plan.         Arthor Captain, PA-C 05/15/12 1655

## 2012-05-15 NOTE — ED Notes (Signed)
C/o nasal congestion started last night, unrelieved with Mucinex, and difficulty getting a big breath, no resp distress.

## 2012-05-15 NOTE — ED Provider Notes (Signed)
Medical screening examination/treatment/procedure(s) were performed by non-physician practitioner and as supervising physician I was immediately available for consultation/collaboration.  Doug Sou, MD 05/15/12 [phone]

## 2015-09-02 ENCOUNTER — Emergency Department (HOSPITAL_COMMUNITY)
Admission: EM | Admit: 2015-09-02 | Discharge: 2015-09-02 | Disposition: A | Discharge status: Home / Self Care | Payer: Medicaid Other | Attending: Emergency Medicine | Admitting: Emergency Medicine

## 2015-09-02 ENCOUNTER — Encounter (HOSPITAL_COMMUNITY): Payer: Self-pay | Admitting: Emergency Medicine

## 2015-09-02 ENCOUNTER — Emergency Department (HOSPITAL_COMMUNITY): Payer: Medicaid Other

## 2015-09-02 DIAGNOSIS — R05 Cough: Secondary | ICD-10-CM

## 2015-09-02 DIAGNOSIS — J988 Other specified respiratory disorders: Secondary | ICD-10-CM | POA: Diagnosis not present

## 2015-09-02 DIAGNOSIS — R059 Cough, unspecified: Secondary | ICD-10-CM

## 2015-09-02 DIAGNOSIS — Z79899 Other long term (current) drug therapy: Secondary | ICD-10-CM | POA: Insufficient documentation

## 2015-09-02 MED ORDER — AZITHROMYCIN 250 MG PO TABS: ORAL_TABLET | ORAL | Status: DC

## 2015-09-02 MED ORDER — BENZONATATE 100 MG PO CAPS: 100.0000 mg | ORAL_CAPSULE | Freq: Three times a day (TID) | ORAL | Status: DC

## 2015-09-02 NOTE — ED Provider Notes (Signed)
CSN: [id number]     Arrival date & time 09/02/15  1821 History   First MD Initiated Contact with Patient 09/02/15 1835     Chief Complaint  Patient presents with  . Cough    (Consider location/radiation/quality/duration/timing/severity/associated sxs/prior Treatment) HPI Comments: Patient presents with complaint of cough and fever to 102F at home for the past 2 days. No ear pain, runny nose, sore throat. Treated at home with ibuprofen with some relief. No nausea, vomiting, diarrhea. No chest pain or abdominal pain. Family members are sick at home with similar symptoms. Onset of symptoms acute. Course is constant. Nothing makes symptoms worse.  Patient is a 44 y.o. female presenting with cough. The history is provided by the patient.  Cough Associated symptoms: fever   Associated symptoms: no chest pain, no headaches, no myalgias, no rash, no rhinorrhea, no shortness of breath, no sore throat and no wheezing     History reviewed. No pertinent past medical history. Past Surgical History  Procedure Laterality Date  . Back surgery      L5   History reviewed. No pertinent family history. Social History  Substance Use Topics  . Smoking status: Never Smoker   . Smokeless tobacco: None  . Alcohol Use: No   OB History    No data available     Review of Systems  Constitutional: Positive for fever.  HENT: Negative for rhinorrhea and sore throat.   Eyes: Negative for redness.  Respiratory: Positive for cough. Negative for shortness of breath and wheezing.   Cardiovascular: Negative for chest pain.  Gastrointestinal: Negative for nausea, vomiting, abdominal pain and diarrhea.  Genitourinary: Negative for dysuria.  Musculoskeletal: Negative for myalgias.  Skin: Negative for rash.  Neurological: Negative for headaches.    Allergies  Review of patient's allergies indicates no known allergies.  Home Medications   Prior to Admission medications   Medication Sig Start Date End  Date Taking? Authorizing Provider  diphenhydrAMINE (BENADRYL) 25 MG tablet Take 25 mg by mouth every 6 (six) hours as needed. FOR ALLERGIES    Historical Provider, MD  diphenhydrAMINE (BENADRYL) 25 MG tablet Take 25 mg by mouth every 6 (six) hours. FOR PAIN 12/05/11 01/04/12  Elpidio AnisShari Upstill, PA-C  ibuprofen (ADVIL,MOTRIN) 200 MG tablet Take 800 mg by mouth every 6 (six) hours as needed. As needed for pain.    Historical Provider, MD  sodium chloride (OCEAN NASAL SPRAY) 0.65 % nasal spray Place 1 spray into the nose as needed for congestion. 05/15/12 05/15/13  Arthor CaptainAbigail Harris, PA-C   BP 110/67 mmHg  Pulse 84  Temp(Src) 98.2 F (36.8 C) (Oral)  Resp 18  SpO2 98%   Physical Exam  Constitutional: She appears well-developed and well-nourished.  HENT:  Head: Normocephalic and atraumatic.  Eyes: Conjunctivae are normal. Right eye exhibits no discharge. Left eye exhibits no discharge.  Neck: Normal range of motion. Neck supple.  Cardiovascular: Normal rate, regular rhythm and normal heart sounds.   Pulmonary/Chest: Effort normal and breath sounds normal.  Abdominal: Soft. There is no tenderness.  Neurological: She is alert.  Skin: Skin is warm and dry.  Psychiatric: She has a normal mood and affect.  Nursing note and vitals reviewed.   ED Course  Procedures (including critical care time) Labs Review Labs Reviewed - No data to display  Imaging Review Dg Chest 2 View  09/02/2015  CLINICAL DATA:  Cough, fever, chest pain for 3 days EXAM: CHEST  2 VIEW COMPARISON:  None. FINDINGS: The heart size  and mediastinal contours are within normal limits. Both lungs are clear. The visualized skeletal structures are unremarkable. IMPRESSION: No active cardiopulmonary disease. Electronically Signed   By: Elige Ko   On: 09/02/2015 19:21   I have personally reviewed and evaluated these images and lab results as part of my medical decision-making.   EKG Interpretation None       7:10 PM Patient seen  and examined. Work-up initiated.   Vital signs reviewed and are as follows: BP 110/67 mmHg  Pulse 84  Temp(Src) 98.2 F (36.8 C) (Oral)  Resp 18  SpO2 98%  8:18 PM X-rays negative for pneumonia. Given sick contacts with pneumonia, will give prescription for azithromycin. Patient told that she does not need to fill this immediately. She can continue to monitor symptoms for the next 48 hours. If she develops a high persistent fever or feels worse she should fill antibiotics. If she does not feel better in 48 hours, she should fill antibiotics. Return to emergency department with worsening shortness of breath.    MDM   Final diagnoses:  Cough  Respiratory infection   patient with cough, fever. Chest x-ray is negative. Multiple recent sick contacts with pneumonia. Will treat conservatively. Antibiotics if not improved in 2 days. Return instructions as above. Discharged home.   Renne Crigler, PA-C 09/02/15 2034  Cathren Laine, MD 09/02/15 [phone]

## 2015-09-02 NOTE — ED Notes (Signed)
Pt states she has had cough and cold symptoms x 3 days with fever. Denies vomiting or diarrhea.

## 2015-09-02 NOTE — Discharge Instructions (Signed)
Please read and follow all provided instructions.  Your diagnoses today include:  1. Cough   2. Respiratory infection     You appear to have an upper respiratory infection (URI). An upper respiratory tract infection, or cold, is a viral infection of the air passages leading to the lungs. It should improve gradually after 5-7 days. You may have a lingering cough that lasts for 2- 4 weeks after the infection.  Tests performed today include:  Vital signs. See below for your results today.   Medications prescribed:  Azithromycin - antibiotic for respiratory infection  You have been prescribed an antibiotic medicine: take the entire course of medicine even if you are feeling better. Stopping early can cause the antibiotic not to work.   Tessalon Perles - cough suppressant medication   Take any prescribed medications only as directed. Treatment for your infection is aimed at treating the symptoms. There are no medications, such as antibiotics, that will cure your infection.   Home care instructions:  Follow any educational materials contained in this packet.   Your illness is contagious and can be spread to others, especially during the first 3 or 4 days. It cannot be cured by antibiotics or other medicines. Take basic precautions such as washing your hands often, covering your mouth when you cough or sneeze, and avoiding public places where you could spread your illness to others.   Please continue drinking plenty of fluids.  Use over-the-counter medicines as needed as directed on packaging for symptom relief.  You may also use ibuprofen or tylenol as directed on packaging for pain or fever.  Do not take multiple medicines containing Tylenol or acetaminophen to avoid taking too much of this medication.  Follow-up instructions: Please follow-up with your primary care provider in the next 3 days for further evaluation of your symptoms if you are not feeling better.   Return instructions:     Please return to the Emergency Department if you experience worsening symptoms.   RETURN IMMEDIATELY IF you develop shortness of breath, confusion or altered mental status, a new rash, become dizzy, faint, or poorly responsive, or are unable to be cared for at home.  Please return if you have persistent vomiting and cannot keep down fluids or develop a fever that is not controlled by tylenol or motrin.    Please return if you have any other emergent concerns.  Additional Information:  Your vital signs today were: BP 110/67 mmHg   Pulse 84   Temp(Src) 98.2 F (36.8 C) (Oral)   Resp 18   SpO2 98%   LMP 08/12/2015 (Approximate) If your blood pressure (BP) was elevated above 135/85 this visit, please have this repeated by your doctor within one month. --------------

## 2015-09-29 ENCOUNTER — Other Ambulatory Visit: Payer: Self-pay | Admitting: Radiology

## 2016-03-04 ENCOUNTER — Encounter (HOSPITAL_COMMUNITY): Payer: Self-pay | Admitting: Emergency Medicine

## 2016-03-04 DIAGNOSIS — R3915 Urgency of urination: Secondary | ICD-10-CM | POA: Diagnosis not present

## 2016-03-04 DIAGNOSIS — M79671 Pain in right foot: Secondary | ICD-10-CM | POA: Diagnosis not present

## 2016-03-04 DIAGNOSIS — M79672 Pain in left foot: Secondary | ICD-10-CM | POA: Diagnosis not present

## 2016-03-04 DIAGNOSIS — Z79899 Other long term (current) drug therapy: Secondary | ICD-10-CM | POA: Diagnosis not present

## 2016-03-04 DIAGNOSIS — R350 Frequency of micturition: Secondary | ICD-10-CM | POA: Diagnosis not present

## 2016-03-04 DIAGNOSIS — R600 Localized edema: Secondary | ICD-10-CM | POA: Insufficient documentation

## 2016-03-04 NOTE — ED Notes (Signed)
Pt reports swelling to bilateral feet for 6 days with worsening pain. Pt sts that it is hard to walk. Pt denies x of CHF or blood clots.

## 2016-03-05 ENCOUNTER — Emergency Department (HOSPITAL_COMMUNITY)
Admission: EM | Admit: 2016-03-05 | Discharge: 2016-03-05 | Disposition: A | Discharge status: Home / Self Care | Payer: Medicaid Other | Attending: Emergency Medicine | Admitting: Emergency Medicine

## 2016-03-05 DIAGNOSIS — M79673 Pain in unspecified foot: Secondary | ICD-10-CM

## 2016-03-05 DIAGNOSIS — R609 Edema, unspecified: Secondary | ICD-10-CM

## 2016-03-05 LAB — URINALYSIS, ROUTINE W REFLEX MICROSCOPIC
Bilirubin Urine: NEGATIVE
Glucose, UA: NEGATIVE mg/dL
Hgb urine dipstick: NEGATIVE
Ketones, ur: NEGATIVE mg/dL
Leukocytes, UA: NEGATIVE
Nitrite: NEGATIVE
Protein, ur: NEGATIVE mg/dL
Specific Gravity, Urine: 1.013 (ref 1.005–1.030)
pH: 6 (ref 5.0–8.0)

## 2016-03-05 LAB — BASIC METABOLIC PANEL
Anion gap: 9 (ref 5–15)
BUN: 6 mg/dL (ref 6–20)
CO2: 22 mmol/L (ref 22–32)
Calcium: 9.1 mg/dL (ref 8.9–10.3)
Chloride: 105 mmol/L (ref 101–111)
Creatinine, Ser: 0.65 mg/dL (ref 0.44–1.00)
GFR calc Af Amer: >60 mL/min (ref >60)
GFR calc non Af Amer: >60 mL/min (ref >60)
Glucose, Bld: 113 mg/dL — ABNORMAL HIGH (ref 65–99)
Potassium: 3.3 mmol/L — ABNORMAL LOW (ref 3.5–5.1)
Sodium: 136 mmol/L (ref 135–145)

## 2016-03-05 LAB — CBC WITH DIFFERENTIAL/PLATELET
Basophils Absolute: 0 10*3/uL (ref 0.0–0.1)
Basophils Relative: 0 %
Eosinophils Absolute: 0.3 10*3/uL (ref 0.0–0.7)
Eosinophils Relative: 6 %
HCT: 31.9 % — ABNORMAL LOW (ref 36.0–46.0)
Hemoglobin: 10 g/dL — ABNORMAL LOW (ref 12.0–15.0)
Lymphocytes Relative: 36 %
Lymphs Abs: 1.9 10*3/uL (ref 0.7–4.0)
MCH: 25.4 pg — ABNORMAL LOW (ref 26.0–34.0)
MCHC: 31.3 g/dL (ref 30.0–36.0)
MCV: 81.2 fL (ref 78.0–100.0)
Monocytes Absolute: 0.4 10*3/uL (ref 0.1–1.0)
Monocytes Relative: 8 %
Neutro Abs: 2.7 10*3/uL (ref 1.7–7.7)
Neutrophils Relative %: 50 %
Platelets: 278 10*3/uL (ref 150–400)
RBC: 3.93 MIL/uL (ref 3.87–5.11)
RDW: 12.5 % (ref 11.5–15.5)
WBC: 5.4 10*3/uL (ref 4.0–10.5)

## 2016-03-05 MED ORDER — GABAPENTIN 300 MG PO CAPS: 300.0000 mg | ORAL_CAPSULE | Freq: Every day | ORAL | Status: AC

## 2016-03-05 NOTE — ED Provider Notes (Signed)
CSN: [id number]     Arrival date & time 03/04/16  2156 History  By signing my name below, I, Phillis HaggisGabriella Gaje, attest that this documentation has been prepared under the direction and in the presence of Benjiman CoreNathan Deaunna Olarte, MD. Electronically Signed: Phillis HaggisGabriella Gaje, ED Scribe. 03/05/2016. 12:50 AM.  Chief Complaint  Patient presents with  . Foot Swelling   The history is provided by the patient. No language interpreter was used.  HPI Comments: Anne French is a 45 y.o. female who presents to the Emergency Department complaining of gradually worsening bilateral foot pain and swelling onset one week ago. Pt reports associated congestion, subjective fever, frequency, urgency, and swelling and pain to the bilateral hands, fingers, and wrists. She states that it is difficult for her to walk. She has been taking Tramadol, Robaxin and ibuprofen 600 mg every 4 hours for pain to no relief. She denies chills, chest pain, SOB, dysuria, numbness, or weakness. She denies hx of CHF or blood clots.   History reviewed. No pertinent past medical history. Past Surgical History  Procedure Laterality Date  . Back surgery      L5   History reviewed. No pertinent family history. Social History  Substance Use Topics  . Smoking status: Never Smoker   . Smokeless tobacco: None  . Alcohol Use: No   OB History    No data available     Review of Systems  Constitutional: Negative for chills.  Respiratory: Negative for shortness of breath.   Cardiovascular: Positive for leg swelling. Negative for chest pain.  Genitourinary: Positive for urgency and frequency. Negative for dysuria.  Musculoskeletal: Positive for joint swelling and arthralgias.  Neurological: Negative for weakness and numbness.  All other systems reviewed and are negative.   Allergies  Review of patient's allergies indicates no known allergies.  Home Medications   Prior to Admission medications   Medication Sig Start Date End Date Taking?  Authorizing Provider  azithromycin (ZITHROMAX Z-PAK) 250 MG tablet Take two tablets PO on day 1 and one tablet PO days 2-5 09/02/15   Renne CriglerJoshua Geiple, PA-C  benzonatate (TESSALON) 100 MG capsule Take 1 capsule (100 mg total) by mouth every 8 (eight) hours. 09/02/15   Renne CriglerJoshua Geiple, PA-C  diphenhydrAMINE (BENADRYL) 25 MG tablet Take 25 mg by mouth every 6 (six) hours as needed. FOR ALLERGIES    Historical Provider, MD  diphenhydrAMINE (BENADRYL) 25 MG tablet Take 25 mg by mouth every 6 (six) hours. FOR PAIN 12/05/11 01/04/12  Elpidio AnisShari Upstill, PA-C  ibuprofen (ADVIL,MOTRIN) 200 MG tablet Take 800 mg by mouth every 6 (six) hours as needed. As needed for pain.    Historical Provider, MD  sodium chloride (OCEAN NASAL SPRAY) 0.65 % nasal spray Place 1 spray into the nose as needed for congestion. 05/15/12 05/15/13  Arthor CaptainAbigail Harris, PA-C   BP 112/70 mmHg  Pulse 95  Temp(Src) 98 F (36.7 C) (Oral)  Resp 20  Ht 5\' 4"  (1.626 m)  Wt 192 lb 4 oz (87.204 kg)  BMI 32.98 kg/m2  SpO2 100%  LMP 03/04/2016 (Exact Date) Physical Exam  Constitutional: She is oriented to person, place, and time. She appears well-developed and well-nourished.  HENT:  Head: Normocephalic and atraumatic.  Eyes: EOM are normal. Pupils are equal, round, and reactive to light.  Neck: Normal range of motion. Neck supple.  Cardiovascular: Normal rate, regular rhythm and normal heart sounds.  Exam reveals no gallop and no friction rub.   No murmur heard. Pulmonary/Chest: Effort normal  and breath sounds normal. She has no wheezes.  Abdominal: Soft. There is no tenderness.  Musculoskeletal: Normal range of motion. She exhibits edema.  Non-pitting edema to bilateral ankles and feet, worse on the right. DP pulse intact, no erythema or induration; edema with mild tenderness to the right third finger; slight edema over dorsal wrist, worst laterally on the right side   Neurological: She is alert and oriented to person, place, and time.  Skin: Skin  is warm and dry.  Psychiatric: She has a normal mood and affect. Her behavior is normal.  Nursing note and vitals reviewed.   ED Course  Procedures (including critical care time) DIAGNOSTIC STUDIES: Oxygen Saturation is 100% on RA, normal by my interpretation.    COORDINATION OF CARE: 12:47 AM-Discussed treatment plan which includes labs with pt at bedside and pt agreed to plan.    Labs Review Labs Reviewed - No data to display  Imaging Review No results found. I have personally reviewed and evaluated these images and lab results as part of my medical decision-making.   EKG Interpretation None      MDM   Final diagnoses:  None    Patient with foot edema bilaterally. Also some edema on wrists. Lab work reassuring. Does not appear to be in CHF. Has pain with it. Has seen her primary doctor for her. Will start low-dose Neurontin since it is  *burning. Follow-up with PCP.  I personally performed the services described in this documentation, which was scribed in my presence. The recorded information has been reviewed and is accurate.     Benjiman Core, MD 03/05/16 [phone]

## 2016-03-05 NOTE — ED Notes (Signed)
Pt up to restroom.  Gait steady, but pt obviously in pain.  Pt able to ambulate independently.

## 2016-03-05 NOTE — ED Notes (Signed)
MD at bedside. 

## 2016-03-05 NOTE — ED Notes (Signed)
Patient able to ambulate independently  

## 2016-03-05 NOTE — Discharge Instructions (Signed)
Edema °Edema is an abnormal buildup of fluids in your body tissues. Edema is somewhat dependent on gravity to pull the fluid to the lowest place in your body. That makes the condition more common in the legs and thighs (lower extremities). Painless swelling of the feet and ankles is common and becomes more likely as you get older. It is also common in looser tissues, like around your eyes.  °When the affected area is squeezed, the fluid may move out of that spot and leave a dent for a few moments. This dent is called pitting.  °CAUSES  °There are many possible causes of edema. Eating too much salt and being on your feet or sitting for a long time can cause edema in your legs and ankles. Hot weather may make edema worse. Common medical causes of edema include: °· Heart failure. °· Liver disease. °· Kidney disease. °· Weak blood vessels in your legs. °· Cancer. °· An injury. °· Pregnancy. °· Some medications. °· Obesity.  °SYMPTOMS  °Edema is usually painless. Your skin may look swollen or shiny.  °DIAGNOSIS  °Your health care provider may be able to diagnose edema by asking about your medical history and doing a physical exam. You may need to have tests such as X-rays, an electrocardiogram, or blood tests to check for medical conditions that may cause edema.  °TREATMENT  °Edema treatment depends on the cause. If you have heart, liver, or kidney disease, you need the treatment appropriate for these conditions. General treatment may include: °· Elevation of the affected body part above the level of your heart. °· Compression of the affected body part. Pressure from elastic bandages or support stockings squeezes the tissues and forces fluid back into the blood vessels. This keeps fluid from entering the tissues. °· Restriction of fluid and salt intake. °· Use of a water pill (diuretic). These medications are appropriate only for some types of edema. They pull fluid out of your body and make you urinate more often. This  gets rid of fluid and reduces swelling, but diuretics can have side effects. Only use diuretics as directed by your health care provider. °HOME CARE INSTRUCTIONS  °· Keep the affected body part above the level of your heart when you are lying down.   °· Do not sit still or stand for prolonged periods.   °· Do not put anything directly under your knees when lying down. °· Do not wear constricting clothing or garters on your upper legs.   °· Exercise your legs to work the fluid back into your blood vessels. This may help the swelling go down.   °· Wear elastic bandages or support stockings to reduce ankle swelling as directed by your health care provider.   °· Eat a low-salt diet to reduce fluid if your health care provider recommends it.   °· Only take medicines as directed by your health care provider.  °SEEK MEDICAL CARE IF:  °· Your edema is not responding to treatment. °· You have heart, liver, or kidney disease and notice symptoms of edema. °· You have edema in your legs that does not improve after elevating them.   °· You have sudden and unexplained weight gain. °SEEK IMMEDIATE MEDICAL CARE IF:  °· You develop shortness of breath or chest pain.   °· You cannot breathe when you lie down. °· You develop pain, redness, or warmth in the swollen areas.   °· You have heart, liver, or kidney disease and suddenly get edema. °· You have a fever and your symptoms suddenly get worse. °MAKE SURE YOU:  °·   Understand these instructions. °· Will watch your condition. °· Will get help right away if you are not doing well or get worse. °  °This information is not intended to replace advice given to you by your health care provider. Make sure you discuss any questions you have with your health care provider. °  °Document Released: 09/19/2005 Document Revised: 10/10/2014 Document Reviewed: 07/12/2013 °Elsevier Interactive Patient Education ©2016 [employer] ° °

## 2016-03-05 NOTE — ED Notes (Signed)
MD at bedside updating patient.

## 2019-01-29 ENCOUNTER — Other Ambulatory Visit (HOSPITAL_COMMUNITY): Payer: Self-pay | Admitting: Family Medicine

## 2019-01-29 ENCOUNTER — Other Ambulatory Visit: Payer: Self-pay | Admitting: Family Medicine

## 2019-01-29 DIAGNOSIS — R079 Chest pain, unspecified: Secondary | ICD-10-CM

## 2019-03-12 ENCOUNTER — Encounter (HOSPITAL_COMMUNITY): Payer: Self-pay

## 2019-03-12 ENCOUNTER — Ambulatory Visit (HOSPITAL_COMMUNITY): Admission: RE | Admit: 2019-03-12 | Payer: Medicaid Other | Source: Ambulatory Visit

## 2019-05-28 ENCOUNTER — Other Ambulatory Visit: Payer: Self-pay | Admitting: Family Medicine

## 2019-05-28 DIAGNOSIS — R079 Chest pain, unspecified: Secondary | ICD-10-CM

## 2019-05-28 DIAGNOSIS — R0602 Shortness of breath: Secondary | ICD-10-CM

## 2019-06-06 ENCOUNTER — Telehealth: Payer: Self-pay | Admitting: Medical

## 2019-06-06 ENCOUNTER — Encounter (HOSPITAL_COMMUNITY)
Admission: RE | Admit: 2019-06-06 | Discharge: 2019-06-06 | Disposition: A | Discharge status: Home / Self Care | Payer: Medicaid Other | Source: Ambulatory Visit | Attending: Family Medicine | Admitting: Family Medicine

## 2019-06-06 ENCOUNTER — Other Ambulatory Visit: Payer: Self-pay | Admitting: Family Medicine

## 2019-06-06 ENCOUNTER — Other Ambulatory Visit: Payer: Self-pay

## 2019-06-06 DIAGNOSIS — R0602 Shortness of breath: Secondary | ICD-10-CM | POA: Diagnosis present

## 2019-06-06 DIAGNOSIS — R079 Chest pain, unspecified: Secondary | ICD-10-CM

## 2019-06-06 DIAGNOSIS — R0600 Dyspnea, unspecified: Secondary | ICD-10-CM

## 2019-06-06 LAB — NM MYOCAR MULTI W/SPECT W/WALL MOTION / EF
Estimated workload: 1 METS
Exercise duration (min): 0 min
Exercise duration (sec): 0 s
MPHR: 173 {beats}/min
Peak HR: 111 {beats}/min
Percent HR: 64 %
Rest HR: 69 {beats}/min

## 2019-06-06 MED ORDER — TECHNETIUM TC 99M TETROFOSMIN IV KIT
30.0000 | PACK | Freq: Once | INTRAVENOUS | Status: AC | PRN
Start: 2019-06-06 — End: 2019-06-06
  Administered 2019-06-06: 11:00:00 30 via INTRAVENOUS

## 2019-06-06 MED ORDER — REGADENOSON 0.4 MG/5ML IV SOLN
INTRAVENOUS | Status: AC
  Filled 2019-06-06: qty 5

## 2019-06-06 MED ORDER — TECHNETIUM TC 99M TETROFOSMIN IV KIT
10.0000 | PACK | Freq: Once | INTRAVENOUS | Status: AC | PRN
  Administered 2019-06-06: 10 via INTRAVENOUS

## 2019-06-06 MED ORDER — REGADENOSON 0.4 MG/5ML IV SOLN
0.4000 mg | Freq: Once | INTRAVENOUS | Status: AC
  Administered 2019-06-06: 11:00:00 0.4 mg via INTRAVENOUS

## 2019-06-06 NOTE — Telephone Encounter (Signed)
    Anne French presented for a nuclear stress test today.  She confirms LMP 05/13/2019 and denies possibility of being pregnant. No immediate complications occurred during the procedure.  Stress imaging is pending at this time.  Patient to follow with Dr. Ronnald Ramp [facility]) for results - appointment scheduled for 06/13/2019.    Abigail Butts, PA-C 06/06/2019, 10:58 AM

## 2021-10-13 ENCOUNTER — Encounter (HOSPITAL_COMMUNITY): Payer: Self-pay | Admitting: Emergency Medicine

## 2021-10-13 ENCOUNTER — Emergency Department (HOSPITAL_COMMUNITY)
Admission: EM | Admit: 2021-10-13 | Discharge: 2021-10-13 | Disposition: A | Discharge status: Home / Self Care | Payer: Medicaid Other | Attending: Emergency Medicine | Admitting: Emergency Medicine

## 2021-10-13 ENCOUNTER — Other Ambulatory Visit: Payer: Self-pay

## 2021-10-13 ENCOUNTER — Emergency Department (HOSPITAL_COMMUNITY): Payer: Medicaid Other

## 2021-10-13 DIAGNOSIS — R52 Pain, unspecified: Secondary | ICD-10-CM

## 2021-10-13 DIAGNOSIS — M25562 Pain in left knee: Secondary | ICD-10-CM | POA: Diagnosis not present

## 2021-10-13 DIAGNOSIS — Z5321 Procedure and treatment not carried out due to patient leaving prior to being seen by health care provider: Secondary | ICD-10-CM | POA: Diagnosis not present

## 2021-10-13 NOTE — ED Triage Notes (Signed)
Onset 5 days ago developed pain left knee while after wearing high heals. States has a history of RA and pain is worsening overtime.

## 2021-10-13 NOTE — ED Notes (Signed)
Called x3 with no response 

## 2021-10-25 ENCOUNTER — Ambulatory Visit (INDEPENDENT_AMBULATORY_CARE_PROVIDER_SITE_OTHER): Payer: Medicaid Other

## 2021-10-25 ENCOUNTER — Ambulatory Visit (INDEPENDENT_AMBULATORY_CARE_PROVIDER_SITE_OTHER): Payer: Medicaid Other | Admitting: Physician Assistant

## 2021-10-25 ENCOUNTER — Encounter: Payer: Self-pay | Admitting: Physician Assistant

## 2021-10-25 ENCOUNTER — Other Ambulatory Visit: Payer: Self-pay

## 2021-10-25 VITALS — Ht 64.0 in | Wt 221.0 lb

## 2021-10-25 DIAGNOSIS — G8929 Other chronic pain: Secondary | ICD-10-CM | POA: Diagnosis not present

## 2021-10-25 DIAGNOSIS — M25562 Pain in left knee: Secondary | ICD-10-CM | POA: Diagnosis not present

## 2021-10-25 MED ORDER — METHYLPREDNISOLONE ACETATE 40 MG/ML IJ SUSP
40.0000 mg | INTRAMUSCULAR | Status: AC | PRN
  Administered 2021-10-25: 40 mg via INTRA_ARTICULAR

## 2021-10-25 MED ORDER — LIDOCAINE HCL 1 % IJ SOLN
5.0000 mL | INTRAMUSCULAR | Status: AC | PRN
  Administered 2021-10-25: 5 mL

## 2021-10-25 NOTE — Progress Notes (Signed)
Office Visit Note   Patient: Anne French           Date of Birth: 10-Jun-1971           MRN: [id number] Visit Date: 10/25/2021              Requested by: No referring provider defined for this encounter. PCP: Pcp, No  Chief Complaint  Patient presents with   Left Knee - Pain      HPI: Patient is a pleasant 51 year old woman with a history of left knee pain.  She does have a history of rheumatoid arthritis.  This left knee pain began a few weeks ago when she was wearing a pair high heels and felt a click.  She has mostly medial and anterior pain.  She was given Voltaren gel to use topically which is helped a little bit with this bout swelling.  She has not had any injections in the past  Assessment & Plan: Visit Diagnoses:  1. Chronic pain of left knee     Plan: Left knee arthritis.  Talked her about a steroid injection today she would like to go forward with this if she did not improve we could consider an MRI.  I also did give her a handout on lower extremity knee exercises.  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   Ortho Exam  Patient is alert, oriented, no adenopathy, well-dressed, normal affect, normal respiratory effort. Left knee she does have some warmth but no erythema also has some warmth in the right knee.  No noted effusion mild soft tissue swelling.  She is acutely tender over the medial joint line nothing over the lateral joint line little bit over the patellofemoral joint line.  Good varus valgus stability compartments are soft and nontender  Imaging: XR KNEE 3 VIEW LEFT  Result Date: 10/25/2021 Radiographs of her left knee demonstrate some medial joint space narrowing also through the patellofemoral joint.  No acute osseous changes.  She does have some sclerotic areas on the medial compartment.  Slight varus alignment  No images are  attached to the encounter.  Labs: No results found for: HGBA1C, ESRSEDRATE, CRP, LABURIC, REPTSTATUS, GRAMSTAIN, CULT, LABORGA   No results found for: ALBUMIN, PREALBUMIN, CBC  No results found for: MG No results found for: VD25OH  No results found for: PREALBUMIN CBC EXTENDED Latest Ref Rng & Units 03/05/2016 11/14/2008 11/13/2008  WBC 4.0 - 10.5 K/uL 5.4 7.6 6.1  RBC 3.87 - 5.11 MIL/uL 3.93 3.40(L) 4.20  HGB 12.0 - 15.0 g/dL 10.0(L) 9.3 DELTA CHECK NOTED(L) 11.2(L)  HCT 36.0 - 46.0 % 31.9(L) 27.7(L) 34.1(L)  PLT 150 - 400 K/uL 278 146(L) 175  NEUTROABS 1.7 - 7.7 K/uL 2.7 - -  LYMPHSABS 0.7 - 4.0 K/uL 1.9 - -     Body mass index is 37.93 kg/m.  Orders:  Orders Placed This Encounter  Procedures   XR KNEE 3 VIEW LEFT   No orders of the defined types were placed in this encounter.    Procedures: Large Joint Inj: L knee on 10/25/2021 10:51 AM Indications: pain and diagnostic evaluation Details: 25 G 1.5 in needle, anteromedial approach  Arthrogram: No  Medications: 40 mg methylPREDNISolone acetate 40 MG/ML; 5 mL lidocaine 1 % Outcome: tolerated well, no immediate complications Procedure, treatment alternatives, risks and benefits explained, specific risks discussed. Consent  was given by the patient.     Clinical Data: No additional findings.  ROS:  All other systems negative, except as noted in the HPI. Review of Systems  Objective: Vital Signs: Ht 5\' 4"  (1.626 m)    Wt 221 lb (100.2 kg)    BMI 37.93 kg/m   Specialty Comments:  No specialty comments available.  PMFS History: There are no problems to display for this patient.  History reviewed. No pertinent past medical history.  No family history on file.  Past Surgical History:  Procedure Laterality Date   BACK SURGERY     L5   Social History   Occupational History   Not on file  Tobacco Use   Smoking status: Never   Smokeless tobacco: Not on file  Substance and Sexual Activity   Alcohol use:  No   Drug use: No   Sexual activity: Not on file

## 2021-12-01 ENCOUNTER — Other Ambulatory Visit: Payer: Self-pay | Admitting: Family Medicine

## 2021-12-01 DIAGNOSIS — Z1231 Encounter for screening mammogram for malignant neoplasm of breast: Secondary | ICD-10-CM

## 2023-10-27 ENCOUNTER — Other Ambulatory Visit: Payer: Self-pay

## 2023-10-27 ENCOUNTER — Emergency Department (HOSPITAL_COMMUNITY)
Admission: EM | Admit: 2023-10-27 | Discharge: 2023-10-28 | Disposition: A | Discharge status: Home / Self Care | Payer: Medicaid Other | Attending: Emergency Medicine | Admitting: Emergency Medicine

## 2023-10-27 ENCOUNTER — Emergency Department (HOSPITAL_COMMUNITY): Payer: Medicaid Other

## 2023-10-27 ENCOUNTER — Encounter (HOSPITAL_COMMUNITY): Payer: Self-pay

## 2023-10-27 DIAGNOSIS — R0602 Shortness of breath: Secondary | ICD-10-CM | POA: Diagnosis present

## 2023-10-27 DIAGNOSIS — J9801 Acute bronchospasm: Secondary | ICD-10-CM | POA: Diagnosis not present

## 2023-10-27 DIAGNOSIS — B974 Respiratory syncytial virus as the cause of diseases classified elsewhere: Secondary | ICD-10-CM | POA: Insufficient documentation

## 2023-10-27 DIAGNOSIS — B338 Other specified viral diseases: Secondary | ICD-10-CM

## 2023-10-27 DIAGNOSIS — Z20822 Contact with and (suspected) exposure to covid-19: Secondary | ICD-10-CM | POA: Diagnosis not present

## 2023-10-27 HISTORY — DX: Unspecified osteoarthritis, unspecified site: M19.90

## 2023-10-27 NOTE — ED Triage Notes (Signed)
Pt arrived from home via POV c/o cough and SOB x 3days. Pt states that she has noted some hemoptysis. Pt states that it is getting harder to breathe and nothing is helping

## 2023-10-27 NOTE — ED Provider Triage Note (Signed)
Emergency Medicine Provider Triage Evaluation Note  Anne French , a 53 y.o. female  was evaluated in triage.  Pt complains of for 3 day of cough.  Reports her sputum has been slightly bloody.  Also reports nasal congestion and chills.  Review of Systems  Positive: As above Negative: As above  Physical Exam  BP 125/75   Pulse 80   Temp 98.2 F (36.8 C)   Resp 20   SpO2 98%  Gen:   Awake, no distress   Resp:  Normal effort, talking in full sentences MSK:   Moves extremities without difficulty  Other:  Dry cough while in the room  Medical Decision Making  Medically screening exam initiated at 10:05 PM.  Appropriate orders placed.  Crosby Oyster Boateng was informed that the remainder of the evaluation will be completed by another provider, this initial triage assessment does not replace that evaluation, and the importance of remaining in the ED until their evaluation is complete.     Arabella Merles, PA-C 10/27/23 2205

## 2023-10-28 LAB — RESP PANEL BY RT-PCR (RSV, FLU A&B, COVID)  RVPGX2
Influenza A by PCR: NEGATIVE
Influenza B by PCR: NEGATIVE
Resp Syncytial Virus by PCR: POSITIVE — AB
SARS Coronavirus 2 by RT PCR: NEGATIVE

## 2023-10-28 MED ORDER — ALBUTEROL SULFATE HFA 108 (90 BASE) MCG/ACT IN AERS: 2.0000 | INHALATION_SPRAY | RESPIRATORY_TRACT | Status: AC | PRN

## 2023-10-28 MED ORDER — PREDNISONE 20 MG PO TABS
60.0000 mg | ORAL_TABLET | Freq: Once | ORAL | Status: AC
  Administered 2023-10-28: 60 mg via ORAL
  Filled 2023-10-28: qty 3

## 2023-10-28 MED ORDER — ALBUTEROL SULFATE HFA 108 (90 BASE) MCG/ACT IN AERS
2.0000 | INHALATION_SPRAY | RESPIRATORY_TRACT | Status: DC | PRN
  Administered 2023-10-28: 2 via RESPIRATORY_TRACT
  Filled 2023-10-28: qty 6.7

## 2023-10-28 MED ORDER — PREDNISONE 50 MG PO TABS
50.0000 mg | ORAL_TABLET | Freq: Every day | ORAL | 0 refills | Status: AC
Start: 2023-10-28 — End: ?

## 2023-10-28 MED ORDER — IPRATROPIUM-ALBUTEROL 0.5-2.5 (3) MG/3ML IN SOLN
3.0000 mL | Freq: Once | RESPIRATORY_TRACT | Status: AC
  Administered 2023-10-28: 3 mL via RESPIRATORY_TRACT
  Filled 2023-10-28: qty 3

## 2023-10-28 NOTE — ED Provider Notes (Signed)
[place] EMERGENCY DEPARTMENT AT [facility] Provider Note   CSN: [id number] Arrival date & time: 10/27/23  2147     History  Chief Complaint  Patient presents with   Cough   Shortness of Breath    Anne French is a 53 y.o. female.  The history is provided by the patient.  Cough Associated symptoms: shortness of breath   Shortness of Breath Associated symptoms: cough   She has history of rheumatoid arthritis, GERD and comes in complaining of rhinorrhea, cough productive of clear sputum, subjective fever with associated chills and sweats, shortness of breath for the last 3 days.  Her son has a similar illness.  She has had some occasions where there has been some blood in her sputum.  She denies arthralgias or myalgias.    Home Medications Prior to Admission medications   Medication Sig Start Date End Date Taking? Authorizing Provider  gabapentin (NEURONTIN) 300 MG capsule Take 1 capsule (300 mg total) by mouth at bedtime. 03/05/16   Benjiman Core, MD  ibuprofen (ADVIL,MOTRIN) 200 MG tablet Take 800 mg by mouth every 6 (six) hours as needed. As needed for pain.    [provider]  methocarbamol (ROBAXIN) 500 MG tablet Take 500 mg by mouth at bedtime.    [provider]  traMADol (ULTRAM) 50 MG tablet Take 50 mg by mouth every 12 (twelve) hours as needed for moderate pain.    [provider]      Allergies    Patient has no known allergies.    Review of Systems   Review of Systems  Respiratory:  Positive for cough and shortness of breath.   All other systems reviewed and are negative.   Physical Exam Updated Vital Signs BP 106/64 (BP Location: Right Arm)   Pulse 78   Temp 97.7 F (36.5 C) (Oral)   Resp 18   Ht 5\' 4"  (1.626 m)   Wt 96.2 kg   LMP 10/03/2022 (Approximate)   SpO2 99%   BMI 36.39 kg/m  Physical Exam Vitals and nursing note reviewed.   53 year old female, resting comfortably and in no acute distress.  Vital signs are normal. Oxygen saturation is 99%, which is normal. Head is normocephalic and atraumatic. PERRLA, EOMI. Oropharynx is clear. Neck is nontender and supple without adenopathy. Lungs are clear without rales, wheezes, or rhonchi.  There is a prolonged exhalation phase.  Slight wheezing is noted with forced exhalation and with coughing. Chest is nontender. Heart has regular rate and rhythm without murmur. Abdomen is soft, flat, nontender. Extremities have no cyanosis or edema. Skin is warm and dry without rash. Neurologic: Mental status is normal, cranial nerves are intact, moves all extremities equally.  ED Results / Procedures / Treatments   Labs (all labs ordered are listed, but only abnormal results are displayed) Labs Reviewed  RESP PANEL BY RT-PCR (RSV, FLU A&B, COVID)  RVPGX2 - Abnormal; Notable for the following components:      Result Value   Resp Syncytial Virus by PCR POSITIVE (*)    All other components within normal limits   Radiology DG Chest 2 View Result Date: 10/27/2023 CLINICAL DATA:  Cough for several days with shortness of breath EXAM: CHEST - 2 VIEW COMPARISON:  09/02/2015 FINDINGS: The heart size and mediastinal contours are within normal limits. Both lungs are clear. The visualized skeletal structures are unremarkable. IMPRESSION: No active cardiopulmonary disease. Electronically Signed   By: Eulah Pont.D.  On: 10/27/2023 22:27    Procedures Procedures    Medications Ordered in ED Medications - No data to display  ED Course/ Medical Decision Making/ A&P                                 Medical Decision Making Risk Prescription drug management.   Cough, rhinorrhea suggestive of viral respiratory tract infection such as influenza, COVID-19, RSV.  Consider pneumonia.  Hemoptysis does not sound clinically significant, and I suspect its due to trauma from coughing.  Chest x-ray shows no evidence of pneumonia.  I have independently viewed the  images, and agree with radiologist's interpretation.  I have reviewed her laboratory test, my interpretation is positive PCR for RSV, negative PCR for COVID-19 and influenza.  Symptoms are from RSV infection.  She has evidence of bronchospasm on exam, I have ordered a nebulizer treatment with albuterol and ipratropium.  She had good relief of symptoms with nebulizer treatment.  I am discharging her with prescription for albuterol inhaler.  I have also ordered a dose of prednisone and I am discharging her with a prescription for a 5-day course of prednisone.  Final Clinical Impression(s) / ED Diagnoses Final diagnoses:  RSV infection  Bronchospasm    Rx / DC Orders ED Discharge Orders          Ordered    predniSONE (DELTASONE) 50 MG tablet  Daily        10/28/23 0313    albuterol (VENTOLIN HFA) 108 (90 Base) MCG/ACT inhaler  Every 4 hours PRN        10/28/23 0313              Dione Booze, MD 10/28/23 [phone]

## 2023-10-28 NOTE — Discharge Instructions (Addendum)
Drink plenty of fluids.  Take acetaminophen/ibuprofen as needed for fever or pain.  Please be aware that if you combine ibuprofen and acetaminophen, you will get better relief and you get from taking either medication by itself.  Return if your symptoms are getting worse.
